# Patient Record
Sex: Female | Born: 1935 | Race: White | Hispanic: No | Marital: Married | Smoking: Never smoker
Health system: Southern US, Community
[De-identification: ages and names within clinical notes are randomized; demographics above are authoritative.]

## PROBLEM LIST (undated history)

## (undated) DIAGNOSIS — A419 Sepsis, unspecified organism: Secondary | ICD-10-CM

## (undated) DIAGNOSIS — I2699 Other pulmonary embolism without acute cor pulmonale: Secondary | ICD-10-CM

## (undated) DIAGNOSIS — I2510 Atherosclerotic heart disease of native coronary artery without angina pectoris: Secondary | ICD-10-CM

## (undated) DIAGNOSIS — N390 Urinary tract infection, site not specified: Secondary | ICD-10-CM

## (undated) HISTORY — PX: ABDOMINAL HYSTERECTOMY: SHX81

## (undated) HISTORY — PX: APPENDECTOMY: SHX54

---

## 2014-11-02 ENCOUNTER — Ambulatory Visit
Admission: EM | Admit: 2014-11-02 | Discharge: 2014-11-02 | Disposition: A | Discharge status: Home / Self Care | Payer: BLUE CROSS/BLUE SHIELD | Attending: Internal Medicine | Admitting: Internal Medicine

## 2014-11-02 DIAGNOSIS — G5602 Carpal tunnel syndrome, left upper limb: Secondary | ICD-10-CM

## 2014-11-02 HISTORY — DX: Atherosclerotic heart disease of native coronary artery without angina pectoris: I25.10

## 2014-11-02 HISTORY — DX: Urinary tract infection, site not specified: N39.0

## 2014-11-02 HISTORY — DX: Other pulmonary embolism without acute cor pulmonale: I26.99

## 2014-11-02 HISTORY — DX: Sepsis, unspecified organism: A41.9

## 2014-11-02 MED ORDER — GABAPENTIN 100 MG PO CAPS: 100.0000 mg | ORAL_CAPSULE | Freq: Two times a day (BID) | ORAL | Status: DC

## 2014-11-02 NOTE — Discharge Instructions (Signed)
Your left hand/wrist symptoms seem most consistent with an exacerbation of carpal tunnel syndrome.  Wear splint all the time for the next few days; when pain is not waking you up in the night, can probably change to just wearing it at night. Prescription for gabapentin sent to the [facility] in [place] to try. Keep appointment with Dr Hollace Kinnierarol Klein on 7/13, as scheduled.  Carpal Tunnel Syndrome Carpal tunnel syndrome is a disorder of the nervous system in the wrist that causes pain, hand weakness, and/or loss of feeling. Carpal tunnel syndrome is caused by the compression, stretching, or irritation of the median nerve at the wrist joint. Athletes who experience carpal tunnel syndrome may notice a decrease in their performance to the condition, especially for sports that require strong hand or wrist action.  SYMPTOMS   Tingling, numbness, or burning pain in the hand or fingers.  Inability to sleep due to pain in the hand.  Sharp pains that shoot from the wrist up the arm or to the fingers, especially at night.  Morning stiffness or cramping of the hand.  Thumb weakness, resulting in difficulty holding objects or making a fist.  Shiny, dry skin on the hand.  Reduced performance in any sport requiring a strong grip. CAUSES   Median nerve damage at the wrist is caused by pressure due to swelling, inflammation, or scarred tissue.  Sources of pressure include:  Repetitive gripping or squeezing that causes inflammation of the tendon sheaths.  Scarring or shortening of the ligament that covers the median nerve.  Traumatic injury to the wrist or forearm such as fracture, sprain, or dislocation.  Prolonged hyperextension (wrist bent backward) or hyperflexion (wrist bent downward) of the wrist. RISK INCREASES WITH:  Diabetes mellitus.  Menopause or amenorrhea.  Rheumatoid arthritis.  Raynaud disease.  Pregnancy.  Gout.  Kidney disease.  Ganglion cyst.  Repetitive hand or wrist  action.  Hypothyroidism (underactive thyroid gland).  Repetitive jolting or shaking of the hands or wrist.  Prolonged forceful weight-bearing on the hands. PREVENTION  Bracing the hand and wrist straight during activities that involve repetitive grasping.  For activities that require prolonged extension of the wrist (bending towards the top of the forearm) periodically change the position of your wrists.  Learn and use proper technique in activities that result in the wrist position in neutral to slight extension.  Avoid bending the wrist into full extension or flexion (up or down).  Keep the wrist in a straight (neutral) position. To keep the wrist in this position, wear a splint.  Avoid repetitive hand and wrist motions.  When possible avoid prolonged grasping of items (steering wheel of a car, a pen, a vacuum cleaner, or a rake).  Loosen your grip for activities that require prolonged grasping of items.  Place keyboards and writing surfaces at the correct height as to decrease strain on the wrist and hand.  Alternate work tasks to avoid prolonged wrist flexion.  Avoid pinching activities (needlework and writing) as they may irritate your carpal tunnel syndrome.  If these activities are necessary, complete them for shorter periods of time.  When writing, use a felt tip or rollerball pen and/or build up the grip on a pen to decrease the forces required for writing. PROGNOSIS  Carpal tunnel syndrome is usually curable with appropriate conservative treatment and sometimes resolves spontaneously. For some cases, surgery is necessary, especially if muscle wasting or nerve changes have developed.  RELATED COMPLICATIONS   Permanent numbness and a weak thumb or  fingers in the affected hand.  Permanent paralysis of a portion of the hand and fingers. TREATMENT  Treatment initially consists of stopping activities that aggravate the symptoms as well as medication and ice to reduce  inflammation. A wrist splint is often recommended for wear during activities of repetitive motion as well as at night. It is also important to learn and use proper technique when performing activities that typically cause pain. On occasion, a corticosteroid injection may be given. If symptoms persist despite conservative treatment, surgery may be an option. Surgical techniques free the pinched or compressed nerve. Carpal tunnel surgery is usually performed on an outpatient basis, meaning you go home the same day as surgery. These procedures provide almost complete relief of all symptoms in 95% of patients. Expect at least 2 weeks for healing after surgery. For cases that are the result of repeated jolting or shaking of the hand or wrist or prolonged hyperextension, surgery is not usually recommended because stretching of the median nerve, not compression, is usually the cause of carpal tunnel syndrome in these cases. MEDICATION   If pain medication is necessary, nonsteroidal anti-inflammatory medications, such as aspirin and ibuprofen, or other minor pain relievers, such as acetaminophen, are often recommended.  Do not take pain medication for 7 days before surgery.  Prescription pain relievers are usually only prescribed after surgery. Use only as directed and only as much as you need.  Corticosteroid injections may be given to reduce inflammation. However, they are not always recommended.  Vitamin B6 (pyridoxine) may reduce symptoms; use only if prescribed for your disorder. SEEK MEDICAL CARE IF:   Symptoms get worse or do not improve in 2 weeks despite treatment.  You also have a current or recent history of neck or shoulder injury that has resulted in pain or tingling elsewhere in your arm. Document Released: 04/10/2005 Document Revised: 08/25/2013 Document Reviewed: 07/23/2008 [facility] Patient Information 2015 [place], [place]. This information is not intended to replace advice given to you by  your health care provider. Make sure you discuss any questions you have with your health care provider.

## 2014-11-02 NOTE — ED Provider Notes (Signed)
CSN: [id number]     Arrival date & time 11/02/14  4098 History   First MD Initiated Contact with Patient 11/02/14 [id number]     Chief Complaint  Patient presents with  . Hand Pain   HPI Patient is an 81 year old lady who presents with several days history of discomfort in the left hand/wrist, radial aspect. Fingers are tingling, with diminished sensation at times. Pain has been waking her at night for the last 2 nights. She reports that this is actually not a new problem, but she hasn't had this much trouble with that in a long time. She shucked a lot of corn last week, and made pickles, both of which involve a lot of wrist motion. She feels fine otherwise. Range of motion in the hand appears to be intact.   Past Medical History  Diagnosis Date  . Coronary artery disease   . PE (pulmonary embolism)   . Sepsis   . UTI (lower urinary tract infection)    Past Surgical History  Procedure Laterality Date  . Appendectomy    . Abdominal hysterectomy     No family history on file. History  Substance Use Topics  . Smoking status: Never Smoker   . Smokeless tobacco: Not on file  . Alcohol Use: No    Review of Systems  All other systems reviewed and are negative.   Allergies  Review of patient's allergies indicates no known allergies.  Home Medications   Prior to Admission medications   Medication Sig Start Date End Date Taking? Authorizing Provider  aspirin 81 MG tablet Take 81 mg by mouth daily.   Yes Historical Provider, MD  atenolol (TENORMIN) 25 MG tablet Take 12.5 mg by mouth daily.   Yes Historical Provider, MD  cefaclor (CECLOR) 500 MG capsule Take 500 mg by mouth 3 (three) times daily.   Yes Historical Provider, MD  simvastatin (ZOCOR) 40 MG tablet Take 40 mg by mouth daily.   Yes Historical Provider, MD          BP 172/79 mmHg  Pulse 60  Temp(Src) 97.8 F (36.6 C) (Tympanic)  Resp 16  Ht  (1.727 m)  Wt 168 lb (76.204 kg)  BMI 25.55 kg/m2  SpO2 99%  LMP   Physical Exam  Constitutional: She is oriented to person, place, and time. No distress.  Alert, nicely groomed  HENT:  Head: Atraumatic.  Eyes:  Conjugate gaze, no eye redness/drainage  Neck: Neck supple.  Cardiovascular: Normal rate.   Pulmonary/Chest: No respiratory distress.  Abdominal: She exhibits no distension.  Musculoskeletal: Normal range of motion.  No leg swelling Patient is able to move her fingers freely, make a fist, move her wrist freely. No erythema, no bruising. No focal bony tenderness.  Neurological: She is alert and oriented to person, place, and time.  Skin: Skin is warm and dry.  No cyanosis  Nursing note and vitals reviewed.   ED Course  Procedures  Cock-up splint applied to the left wrist by nursing.  MDM   1. Carpal tunnel syndrome, left    Patient should wear the splint at all times for the next several days. When pain is not waking her in the night, she can move to just wearing the splint at bedtime. Discussed a prednisone burst with the patient, she has unfortunately had symptoms of agitation with prednisone in the past so we will hold off on this. Prescription for Neurontin 100 mg twice a day sent to the pharmacy. Patient will  follow-up with her primary care provider, Dr. Hollace Kinnierarol Klein in [place], in 2 days as planned.    Eustace MooreLaura W Lateria Alderman, MD 11/02/14 [phone]

## 2014-11-02 NOTE — ED Notes (Signed)
Pt states "I have pain and numbness in my left thumb, and fingers of my left hand."

## 2015-03-31 ENCOUNTER — Ambulatory Visit
Admission: EM | Admit: 2015-03-31 | Discharge: 2015-03-31 | Disposition: A | Discharge status: Home / Self Care | Payer: Medicare Other | Attending: Family Medicine | Admitting: Family Medicine

## 2015-03-31 ENCOUNTER — Encounter: Payer: Self-pay | Admitting: *Deleted

## 2015-03-31 DIAGNOSIS — M940 Chondrocostal junction syndrome [Tietze]: Secondary | ICD-10-CM | POA: Diagnosis not present

## 2015-03-31 DIAGNOSIS — S20211A Contusion of right front wall of thorax, initial encounter: Secondary | ICD-10-CM | POA: Diagnosis not present

## 2015-03-31 MED ORDER — HYDROCODONE-ACETAMINOPHEN 5-325 MG PO TABS: 1.0000 | ORAL_TABLET | Freq: Four times a day (QID) | ORAL | Status: DC | PRN

## 2015-03-31 NOTE — ED Provider Notes (Signed)
CSN: [id number]     Arrival date & time 03/31/15  [id number] History   First MD Initiated Contact with Patient 03/31/15 1204     Chief Complaint  Patient presents with  . Rib Injury    right side   (Consider location/radiation/quality/duration/timing/severity/associated sxs/prior Treatment) HPI Comments: 81 yo female presents with a 1 week h/o pain to her right lower rib area/rib cage after falling in the bathroom at home 1 week ago. States she tripped over a cord in the bathroom and hit her right lower ribcage against the wall. States pain has continued. Denies any fevers, chills, difficulty breathing, rash, bowel or bladder problems.  The history is provided by the patient.    Past Medical History  Diagnosis Date  . Coronary artery disease   . PE (pulmonary embolism)   . Sepsis (HCC)   . UTI (lower urinary tract infection)    Past Surgical History  Procedure Laterality Date  . Appendectomy    . Abdominal hysterectomy     History reviewed. No pertinent family history. Social History  Substance Use Topics  . Smoking status: Never Smoker   . Smokeless tobacco: Never Used  . Alcohol Use: No   OB History    No data available     Review of Systems  Allergies  Review of patient's allergies indicates no known allergies.  Home Medications   Prior to Admission medications   Medication Sig Start Date End Date Taking? Authorizing Provider  aspirin 81 MG tablet Take 81 mg by mouth daily.   Yes Historical Provider, MD  atenolol (TENORMIN) 25 MG tablet Take 12.5 mg by mouth daily.   Yes Historical Provider, MD  cefaclor (CECLOR) 500 MG capsule Take 500 mg by mouth 3 (three) times daily.   Yes Historical Provider, MD  cholecalciferol (VITAMIN D) 1000 UNITS tablet Take 1,000 Units by mouth daily.   Yes Historical Provider, MD  sertraline (ZOLOFT) 50 MG tablet Take 50 mg by mouth daily.   Yes Historical Provider, MD  simvastatin (ZOCOR) 40 MG tablet Take 40 mg by mouth daily.   Yes  Historical Provider, MD  vitamin B-12 (CYANOCOBALAMIN) 100 MCG tablet Take 100 mcg by mouth daily.   Yes Historical Provider, MD  gabapentin (NEURONTIN) 100 MG capsule Take 1 capsule (100 mg total) by mouth 2 (two) times daily. 11/02/14   Eustace MooreLaura W Murray, MD  HYDROcodone-acetaminophen (NORCO/VICODIN) 5-325 MG tablet Take 1 tablet by mouth every 6 (six) hours as needed. 03/31/15   Payton Mccallumrlando Bodee Lafoe, MD   Meds Ordered and Administered this Visit  Medications - No data to display  BP 160/68 mmHg  Pulse 63  Temp(Src) 97.7 F (36.5 C) (Oral)  Resp 18  Ht 5\' 7"  (1.702 m)  Wt 169 lb (76.658 kg)  BMI 26.46 kg/m2  SpO2 98% No data found.   Physical Exam  Constitutional: She appears well-developed and well-nourished. No distress.  HENT:  Head: Normocephalic and atraumatic.  Right Ear: Tympanic membrane and ear canal normal.  Left Ear: Tympanic membrane and ear canal normal.  Nose: Mucosal edema and rhinorrhea present. No nose lacerations, sinus tenderness, nasal deformity, septal deviation or nasal septal hematoma. No epistaxis.  No foreign bodies. Right sinus exhibits maxillary sinus tenderness and frontal sinus tenderness. Left sinus exhibits maxillary sinus tenderness and frontal sinus tenderness.  Mouth/Throat: Uvula is midline and mucous membranes are normal.  Eyes: Conjunctivae and EOM are normal. Pupils are equal, round, and reactive to light. Right eye exhibits no discharge.  Left eye exhibits no discharge. No scleral icterus.  Neck: Normal range of motion. Neck supple. No thyromegaly present.  Cardiovascular: Normal rate, regular rhythm and normal heart sounds.   Pulmonary/Chest: Effort normal and breath sounds normal. No respiratory distress. She has no wheezes. She has no rales.  Musculoskeletal:  Right lower rib tenderness to palpation; no step off; no erythema, edema, of skin lesions  Lymphadenopathy:    She has no cervical adenopathy.  Skin: She is not diaphoretic.  Nursing note and  vitals reviewed.   ED Course  Procedures (including critical care time)  Labs Review Labs Reviewed - No data to display  Imaging Review No results found.   Visual Acuity Review  Right Eye Distance:   Left Eye Distance:   Bilateral Distance:    Right Eye Near:   Left Eye Near:    Bilateral Near:         MDM   1. Costochondritis   2. Rib contusion, right, initial encounter     Discharge Medication List as of 03/31/2015 12:48 PM    START taking these medications   Details  HYDROcodone-acetaminophen (NORCO/VICODIN) 5-325 MG tablet Take 1 tablet by mouth every 6 (six) hours as needed., Starting 03/31/2015, Until Discontinued, Print       1. diagnosis reviewed with patient 2. rx as per orders above; reviewed possible side effects, interactions, risks and benefits  3. Recommend supportive treatment with rest, ice 4. Follow-up prn if symptoms worsen or don't improve  Payton Mccallum, MD 03/31/15 1431

## 2015-03-31 NOTE — Discharge Instructions (Signed)

## 2015-03-31 NOTE — ED Notes (Signed)
Patient fell about one week ago in her bathroom and bump her right side rib cage. Patient has continued to have pain and is concerned.

## 2016-12-13 ENCOUNTER — Ambulatory Visit: Payer: Medicare Other

## 2016-12-13 ENCOUNTER — Ambulatory Visit
Admission: EM | Admit: 2016-12-13 | Discharge: 2016-12-13 | Disposition: A | Discharge status: Home / Self Care | Payer: Medicare Other | Attending: Family Medicine | Admitting: Family Medicine

## 2016-12-13 ENCOUNTER — Encounter: Payer: Self-pay | Admitting: *Deleted

## 2016-12-13 DIAGNOSIS — I2510 Atherosclerotic heart disease of native coronary artery without angina pectoris: Secondary | ICD-10-CM | POA: Diagnosis not present

## 2016-12-13 DIAGNOSIS — S93601A Unspecified sprain of right foot, initial encounter: Secondary | ICD-10-CM | POA: Diagnosis not present

## 2016-12-13 DIAGNOSIS — Z86711 Personal history of pulmonary embolism: Secondary | ICD-10-CM | POA: Insufficient documentation

## 2016-12-13 DIAGNOSIS — M79671 Pain in right foot: Secondary | ICD-10-CM | POA: Diagnosis present

## 2016-12-13 DIAGNOSIS — X501XXA Overexertion from prolonged static or awkward postures, initial encounter: Secondary | ICD-10-CM | POA: Insufficient documentation

## 2016-12-13 DIAGNOSIS — Z79899 Other long term (current) drug therapy: Secondary | ICD-10-CM | POA: Diagnosis not present

## 2016-12-13 DIAGNOSIS — Z7982 Long term (current) use of aspirin: Secondary | ICD-10-CM | POA: Insufficient documentation

## 2016-12-13 NOTE — ED Triage Notes (Signed)
Patient rolled her right foot and ankle 1 week ago while walking outside.

## 2016-12-13 NOTE — ED Provider Notes (Signed)
[facility]    CSN: [id number] Arrival date & time: 12/13/16  1152     History   Chief Complaint Chief Complaint  Patient presents with  . Ankle Pain    HPI Kara Benjamin is a 83 y.o. female.   83 yo female with a c/o right foot pain and swelling for 1 week since twisting it one week ago. Has been able to ambulate/weightbear, however it's painful.    The history is provided by the patient.  Ankle Pain    Past Medical History:  Diagnosis Date  . Coronary artery disease   . PE (pulmonary embolism)   . Sepsis (HCC)   . UTI (lower urinary tract infection)     There are no active problems to display for this patient.   Past Surgical History:  Procedure Laterality Date  . ABDOMINAL HYSTERECTOMY    . APPENDECTOMY      OB History    No data available       Home Medications    Prior to Admission medications   Medication Sig Start Date End Date Taking? Authorizing Provider  aspirin 81 MG tablet Take 81 mg by mouth daily.   Yes [provider]  atenolol (TENORMIN) 25 MG tablet Take 12.5 mg by mouth daily.   Yes [provider]  cefaclor (CECLOR) 500 MG capsule Take 500 mg by mouth 3 (three) times daily.   Yes [provider]  cholecalciferol (VITAMIN D) 1000 UNITS tablet Take 1,000 Units by mouth daily.   Yes [provider]  sertraline (ZOLOFT) 50 MG tablet Take 50 mg by mouth daily.   Yes [provider]  simvastatin (ZOCOR) 40 MG tablet Take 40 mg by mouth daily.   Yes [provider]  vitamin B-12 (CYANOCOBALAMIN) 100 MCG tablet Take 100 mcg by mouth daily.   Yes [provider]  gabapentin (NEURONTIN) 100 MG capsule Take 1 capsule (100 mg total) by mouth 2 (two) times daily. 11/02/14   Eustace Moore, MD  HYDROcodone-acetaminophen (NORCO/VICODIN) 5-325 MG tablet Take 1 tablet by mouth every 6 (six) hours as needed. 03/31/15   Payton Mccallum, MD    Family History History reviewed. No  pertinent family history.  Social History Social History  Substance Use Topics  . Smoking status: Never Smoker  . Smokeless tobacco: Never Used  . Alcohol use No     Allergies   Patient has no known allergies.   Review of Systems Review of Systems   Physical Exam Triage Vital Signs ED Triage Vitals  Enc Vitals Group     BP 12/13/16 1214 (!) 158/67     Pulse Rate 12/13/16 1214 60     Resp 12/13/16 1214 16     Temp 12/13/16 1214 97.8 F (36.6 C)     Temp Source 12/13/16 1214 Oral     SpO2 12/13/16 1214 96 %     Weight 12/13/16 1217 170 lb (77.1 kg)     Height 12/13/16 1217 5\' 7"  (1.702 m)     Head Circumference --      Peak Flow --      Pain Score 12/13/16 1217 0     Pain Loc --      Pain Edu? --      Excl. in GC? --    No data found.   Updated Vital Signs BP (!) 158/67 (BP Location: Left Arm)   Pulse 60   Temp 97.8 F (36.6 C) (Oral)  Resp 16   Ht 5\' 7"  (1.702 m)   Wt 170 lb (77.1 kg)   SpO2 96%   BMI 26.63 kg/m   Visual Acuity Right Eye Distance:   Left Eye Distance:   Bilateral Distance:    Right Eye Near:   Left Eye Near:    Bilateral Near:     Physical Exam  Constitutional: She appears well-developed and well-nourished. No distress.  Musculoskeletal:       Right foot: There is tenderness, bony tenderness and swelling. There is normal range of motion, normal capillary refill, no crepitus, no deformity and no laceration.  Skin: She is not diaphoretic.  Nursing note and vitals reviewed.    UC Treatments / Results  Labs (all labs ordered are listed, but only abnormal results are displayed) Labs Reviewed - No data to display  EKG  EKG Interpretation None       Radiology Dg Foot Complete Right  Result Date: 12/13/2016 CLINICAL DATA:  Right foot injury 1 week ago with persistent pain over the lateral aspect of the foot near the base of the fifth metatarsal. EXAM: RIGHT FOOT COMPLETE - 3+ VIEW COMPARISON:  None in PACs FINDINGS: The  bones are subjectively osteopenic. There is no acute fracture nor dislocation. There is mild degenerative narrowing of the interphalangeal joints. The MTP and TMT joint spaces are reasonably well-maintained. Specific attention to the base of the fifth metatarsal and the adjacent bones reveals no acute fracture. There are cystic changes in the third cuneiform. The soft tissues are unremarkable. There are plantar and Achilles region calcaneal spurs. IMPRESSION: There is no acute fracture nor dislocation of the bones of the right foot. If the patient's symptoms persist and remain unexplained, MRI would be a useful next imaging step. Electronically Signed   By: David  [place] M.D.   On: 12/13/2016 13:02    Procedures Procedures (including critical care time)  Medications Ordered in UC Medications - No data to display   Initial Impression / Assessment and Plan / UC Course  I have reviewed the triage vital signs and the nursing notes.  Pertinent labs & imaging results that were available during my care of the patient were reviewed by me and considered in my medical decision making (see chart for details).       Final Clinical Impressions(s) / UC Diagnoses   Final diagnoses:  Sprain of right foot, initial encounter    New Prescriptions New Prescriptions   No medications on file   1. X-ray result (negative for fracture) and diagnosis reviewed with patient 2.Recommend supportive treatment with rest, ice,elevation, otc tylenol prn 3. Follow-up prn if symptoms worsen or don't improve  Controlled Substance Prescriptions [place] Controlled Substance Registry consulted? Not Applicable   Payton Mccallum, MD 12/13/16 [phone]

## 2016-12-13 NOTE — Discharge Instructions (Signed)
Rest, ice, elevation, over the counter tylenol °

## 2020-03-25 ENCOUNTER — Other Ambulatory Visit: Payer: Self-pay

## 2020-03-25 ENCOUNTER — Ambulatory Visit (INDEPENDENT_AMBULATORY_CARE_PROVIDER_SITE_OTHER): Payer: BLUE CROSS/BLUE SHIELD

## 2020-03-25 ENCOUNTER — Ambulatory Visit
Admission: EM | Admit: 2020-03-25 | Discharge: 2020-03-25 | Disposition: A | Discharge status: Home / Self Care | Payer: BLUE CROSS/BLUE SHIELD

## 2020-03-25 ENCOUNTER — Encounter: Payer: Self-pay | Admitting: Emergency Medicine

## 2020-03-25 DIAGNOSIS — S52551A Other extraarticular fracture of lower end of right radius, initial encounter for closed fracture: Secondary | ICD-10-CM

## 2020-03-25 DIAGNOSIS — S6991XA Unspecified injury of right wrist, hand and finger(s), initial encounter: Secondary | ICD-10-CM

## 2020-03-25 MED ORDER — TRAMADOL HCL 50 MG PO TABS: 50.0000 mg | ORAL_TABLET | Freq: Four times a day (QID) | ORAL | 0 refills | Status: AC | PRN

## 2020-03-25 NOTE — ED Provider Notes (Signed)
[facility]    CSN: [id number] Arrival date & time: 03/25/20  1301      History   Chief Complaint Chief Complaint  Patient presents with  . Fall    DOI 03/24/20  . Wrist Injury    right    HPI Kara Benjamin is a 86 y.o. female who presents with R wrist pain from a fall yesterday. She states she was getting out of her recliner the the blanket was caught in the chair and her legs got tangled in a blanket and she lost her balance and  and landed on her R hand. Has bruising of her R thumb, but this or her hand does not hurt. Has been taking 2 Tylenols at a time and helps a little.   Past Medical History:  Diagnosis Date  . Coronary artery disease   . PE (pulmonary embolism)   . Sepsis (HCC)   . UTI (lower urinary tract infection)     There are no problems to display for this patient.   Past Surgical History:  Procedure Laterality Date  . ABDOMINAL HYSTERECTOMY    . APPENDECTOMY      OB History   No obstetric history on file.      Home Medications    Prior to Admission medications   Medication Sig Start Date End Date Taking? Authorizing Provider  cholecalciferol (VITAMIN D) 1000 UNITS tablet Take 1,000 Units by mouth daily.   Yes [provider]  losartan (COZAAR) 50 MG tablet Take 50 mg by mouth daily. 01/02/20  Yes [provider]  sertraline (ZOLOFT) 50 MG tablet Take 50 mg by mouth daily.   Yes [provider]  simvastatin (ZOCOR) 40 MG tablet Take 40 mg by mouth daily.   Yes [provider]  vitamin B-12 (CYANOCOBALAMIN) 100 MCG tablet Take 100 mcg by mouth daily.   Yes [provider]  aspirin 81 MG tablet Take 81 mg by mouth daily.    [provider]  atenolol (TENORMIN) 25 MG tablet Take 12.5 mg by mouth daily.    [provider]  carvedilol (COREG) 3.125 MG tablet Take 3.125 mg by mouth 2 (two) times daily. 02/17/20   [provider]  cefaclor (CECLOR) 500 MG capsule Take  500 mg by mouth 3 (three) times daily.    [provider]  gabapentin (NEURONTIN) 100 MG capsule Take 1 capsule (100 mg total) by mouth 2 (two) times daily. 11/02/14   Isa Rankin, MD  HYDROcodone-acetaminophen (NORCO/VICODIN) 5-325 MG tablet Take 1 tablet by mouth every 6 (six) hours as needed. 03/31/15   Payton Mccallum, MD    Family History Family History  Problem Relation Age of Onset  . Other Mother        passed in a nursing home  . Stroke Father     Social History Social History   Tobacco Use  . Smoking status: Never Smoker  . Smokeless tobacco: Never Used  Vaping Use  . Vaping Use: Never used  Substance Use Topics  . Alcohol use: No  . Drug use: No     Allergies   Sulfamethoxazole-trimethoprim and Codeine   Review of Systems Review of Systems  Musculoskeletal: Positive for joint swelling. Negative for back pain and neck pain.       + R wrist pain  Skin: Positive for color change. Negative for rash and wound.  Neurological: Negative for numbness.   Physical Exam Triage Vital Signs ED Triage Vitals  Enc Vitals Group     BP 03/25/20 1316 (!) 192/93     Pulse Rate 03/25/20 1316 79     Resp 03/25/20 1316 18     Temp 03/25/20 1316 97.7 F (36.5 C)     Temp Source 03/25/20 1316 Oral     SpO2 03/25/20 1316 99 %     Weight 03/25/20 1317 164 lb (74.4 kg)     Height 03/25/20 1317 5\' 7"  (1.702 m)     Head Circumference --      Peak Flow --      Pain Score 03/25/20 1315 8     Pain Loc --      Pain Edu? --      Excl. in GC? --    No data found.  Updated Vital Signs BP (!) 192/93 (BP Location: Left Arm)   Pulse 79   Temp 97.7 F (36.5 C) (Oral)   Resp 18   Ht 5\' 7"  (1.702 m)   Wt 164 lb (74.4 kg)   SpO2 99%   BMI 25.69 kg/m   Visual Acuity Right Eye Distance:   Left Eye Distance:   Bilateral Distance:    Right Eye Near:   Left Eye Near:    Bilateral Near:     Physical Exam Vitals and nursing note reviewed.  Constitutional:       General: She is not in acute distress.    Appearance: She is obese. She is not toxic-appearing.  HENT:     Head: Normocephalic.     Right Ear: External ear normal.     Left Ear: External ear normal.  Eyes:     General: No scleral icterus.    Conjunctiva/sclera: Conjunctivae normal.  Pulmonary:     Effort: Pulmonary effort is normal.  Musculoskeletal:     Cervical back: Neck supple.     Comments: R HAND/WRIST- has mild swelling and  ecchymosis of 2/3 of her thumb and dorsal of her  hand, but these areas are not tender. Has neg snuff box tenderness. Has moderate tenderness on distal radiolus with supination of her wrist and palpation of this area. Has decreased ROM of her wrist with no deformity present.   Skin:    General: Skin is warm and dry.     Findings: Bruising present. No erythema.  Neurological:     Mental Status: She is alert and oriented to person, place, and time.     Gait: Gait normal.  Psychiatric:        Mood and Affect: Mood normal.        Behavior: Behavior normal.        Thought Content: Thought content normal.        Judgment: Judgment normal.    UC Treatments / Results  Labs (all labs ordered are listed, but only abnormal results are displayed) Labs Reviewed - No data to display  EKG   Radiology No results found.  Procedures Procedures (including critical care time)  Medications Ordered in UC Medications - No data to display  Initial Impression / Assessment and Plan / UC Course  I have reviewed the triage vital signs and the nursing notes. Has distal radial non displaced fracture. She was placed on a brace and I prescribed her Tramadol to try for pain as noted. Needs to Fu with ortho tomorrow.  Pertinent  imaging results that were available during my care of the patient were reviewed by me and considered in my medical decision making (see  chart for details).   Final Clinical Impressions(s) / UC Diagnoses   Final diagnoses:  None   Discharge  Instructions   None    ED Prescriptions    None     PDMP not reviewed this encounter.   Garey Ham, PA-C 03/25/20 1702

## 2020-03-25 NOTE — ED Triage Notes (Signed)
Patient in today c/o right wrist injury on 03/24/20. Patient states she was getting up and got tangle in a blanket and lost her balance catching herself with her right wrist. Patient has taken OTC Tylenol and Ibuprofen and has been wearing a brace.

## 2020-03-29 ENCOUNTER — Other Ambulatory Visit: Payer: Self-pay

## 2020-03-29 ENCOUNTER — Encounter: Payer: Self-pay | Admitting: Emergency Medicine

## 2020-03-29 ENCOUNTER — Ambulatory Visit
Admission: EM | Admit: 2020-03-29 | Discharge: 2020-03-29 | Disposition: A | Discharge status: Home / Self Care | Payer: Medicare Other | Attending: Emergency Medicine | Admitting: Emergency Medicine

## 2020-03-29 ENCOUNTER — Ambulatory Visit (INDEPENDENT_AMBULATORY_CARE_PROVIDER_SITE_OTHER): Payer: Medicare Other

## 2020-03-29 DIAGNOSIS — S2231XA Fracture of one rib, right side, initial encounter for closed fracture: Secondary | ICD-10-CM

## 2020-03-29 DIAGNOSIS — W19XXXA Unspecified fall, initial encounter: Secondary | ICD-10-CM

## 2020-03-29 DIAGNOSIS — R0781 Pleurodynia: Secondary | ICD-10-CM

## 2020-03-29 NOTE — Discharge Instructions (Addendum)
The chest x-ray shows that you broke your fourth rib on the right side.  This is most likely what is contributing to your pain with movement.  Continue your pain medication as prescribed for your open thumb at home.  Take 10 deep breaths every couple of hours while awake to help keep your lungs inflated and prevent the development of pneumonia.  If you develop shortness of breath or cough up blood then you need to go to the ER for evaluation.

## 2020-03-29 NOTE — ED Triage Notes (Signed)
Patient c/o right side rib pain that started on 03/24/20 after she fell. She states the pain has not gone away and is concerned that she may have broken a rib.

## 2020-03-29 NOTE — ED Provider Notes (Signed)
[facility]    CSN: [id number] Arrival date & time: 03/29/20  1111      History   Chief Complaint Chief Complaint  Patient presents with  . Chest Pain    right side    HPI Kara Benjamin is a 86 y.o. female.   HPI   86 year old female here for evaluation of right posterior rib pain.  Patient reports that she tripped and fell 5 days ago and broke her right thumb.  She reports she did not have any pain in her ribs the day of the injury but noticed that a few days later because it hurt to roll over in bed.  Patient states she is having pain under her right shoulder blade.  She denies shortness of breath, cough, or fever.  Patient has been taking tramadol, Tylenol, and ibuprofen at home which has been helping with the pain.  Past Medical History:  Diagnosis Date  . Coronary artery disease   . PE (pulmonary embolism)   . Sepsis (HCC)   . UTI (lower urinary tract infection)     There are no problems to display for this patient.   Past Surgical History:  Procedure Laterality Date  . ABDOMINAL HYSTERECTOMY    . APPENDECTOMY      OB History   No obstetric history on file.      Home Medications    Prior to Admission medications   Medication Sig Start Date End Date Taking? Authorizing Provider  atenolol (TENORMIN) 25 MG tablet Take 12.5 mg by mouth daily.   Yes [provider]  carvedilol (COREG) 3.125 MG tablet Take 3.125 mg by mouth 2 (two) times daily. 02/17/20  Yes [provider]  cholecalciferol (VITAMIN D) 1000 UNITS tablet Take 1,000 Units by mouth daily.   Yes [provider]  losartan (COZAAR) 50 MG tablet Take 50 mg by mouth daily. 01/02/20  Yes [provider]  sertraline (ZOLOFT) 50 MG tablet Take 50 mg by mouth daily.   Yes [provider]  simvastatin (ZOCOR) 40 MG tablet Take 40 mg by mouth daily.   Yes [provider]  traMADol (ULTRAM) 50 MG tablet Take 1 tablet (50 mg total) by mouth  every 6 (six) hours as needed. 03/25/20  Yes Rodriguez-Southworth, Nettie Elm, PA-C  vitamin B-12 (CYANOCOBALAMIN) 100 MCG tablet Take 100 mcg by mouth daily.   Yes [provider]  gabapentin (NEURONTIN) 100 MG capsule Take 1 capsule (100 mg total) by mouth 2 (two) times daily. 11/02/14 03/25/20  Isa Rankin, MD    Family History Family History  Problem Relation Age of Onset  . Other Mother        passed in a nursing home  . Stroke Father     Social History Social History   Tobacco Use  . Smoking status: Never Smoker  . Smokeless tobacco: Never Used  Vaping Use  . Vaping Use: Never used  Substance Use Topics  . Alcohol use: No  . Drug use: No     Allergies   Sulfamethoxazole-trimethoprim and Codeine   Review of Systems Review of Systems  Constitutional: Negative for activity change, appetite change and fever.  Respiratory: Negative for cough, shortness of breath and wheezing.   Cardiovascular: Negative for chest pain.  Musculoskeletal: Positive for arthralgias and myalgias.  Skin: Positive for color change.  Neurological: Negative for syncope.  Hematological: Negative.   Psychiatric/Behavioral: Negative.      Physical Exam Triage Vital Signs ED Triage Vitals  Enc Vitals Group     BP 03/29/20 1124 (!) 211/81     Pulse Rate 03/29/20 1124 68     Resp 03/29/20 1124 18     Temp 03/29/20 1124 97.8 F (36.6 C)     Temp Source 03/29/20 1124 Oral     SpO2 03/29/20 1124 98 %     Weight 03/29/20 1123 164 lb 0.4 oz (74.4 kg)     Height 03/29/20 1123 5\' 7"  (1.702 m)     Head Circumference --      Peak Flow --      Pain Score 03/29/20 1122 10     Pain Loc --      Pain Edu? --      Excl. in GC? --    No data found.  Updated Vital Signs BP (!) 211/81 (BP Location: Left Arm)   Pulse 68   Temp 97.8 F (36.6 C) (Oral)   Resp 18   Ht 5\' 7"  (1.702 m)   Wt 164 lb 0.4 oz (74.4 kg)   SpO2 98%   BMI 25.69 kg/m   Visual Acuity Right Eye Distance:    Left Eye Distance:   Bilateral Distance:    Right Eye Near:   Left Eye Near:    Bilateral Near:     Physical Exam   UC Treatments / Results  Labs (all labs ordered are listed, but only abnormal results are displayed) Labs Reviewed - No data to display  EKG   Radiology DG Ribs Unilateral W/Chest Right  Result Date: 03/29/2020 CLINICAL DATA:  Fall 03/24/2020.  Right rib pain EXAM: RIGHT RIBS AND CHEST - 3+ VIEW COMPARISON:  None. FINDINGS: Heart size and vascularity normal. Lungs are clear without infiltrate or effusion. Hiatal hernia noted. Prior median sternotomy Mildly displaced fracture right fourth rib anteriorly. Suboptimal evaluation due to osteopenia. No pneumothorax. IMPRESSION: Mildly displaced fracture right anterior fourth rib. No acute cardiopulmonary abnormality Hiatal hernia. Electronically Signed   By: 14/09/2019 M.D.   On: 03/29/2020 12:09    Procedures Procedures (including critical care time)  Medications Ordered in UC Medications - No data to display  Initial Impression / Assessment and Plan / UC Course  I have reviewed the triage vital signs and the nursing notes.  Pertinent labs & imaging results that were available during my care of the patient were reviewed by me and considered in my medical decision making (see chart for details).   Patient is here for evaluation of pain that she describes as being under her right shoulder blade.  Patient fell 5 days ago and fractured her right thumb is currently in a splint.  Her right hand is swollen and ecchymotic.  Patient states that her pain in her right back started a few days after her fall and she noticed that she was having pain when she rolled over in bed.  Patient states it does not hurt to take a deep breath I cannot reproduce the pain well with palpation.  When palpating her right scapular region she actually has pain on the vertebral border of the right scapula in the superior aspect.  There is no  overlying ecchymosis.  No crepitus upon palpation.  Lungs are clear to auscultation.  We will obtain right rib film.  Rib films show a mildly displaced anterior fourth rib fracture.  Will discharge patient home, have her continue her pain medication as prescribed for her broken wrist, and instructed to do deep breathing exercises, 10 deep  breaths every couple hours while awake.   Final Clinical Impressions(s) / UC Diagnoses   Final diagnoses:  Closed fracture of one rib of right side, initial encounter     Discharge Instructions     The chest x-ray shows that you broke your fourth rib on the right side.  This is most likely what is contributing to your pain with movement.  Continue your pain medication as prescribed for your open thumb at home.  Take 10 deep breaths every couple of hours while awake to help keep your lungs inflated and prevent the development of pneumonia.  If you develop shortness of breath or cough up blood then you need to go to the ER for evaluation.    ED Prescriptions    None     PDMP not reviewed this encounter.   Becky Augusta, NP 03/29/20 1231

## 2022-07-27 IMAGING — CR DG RIBS W/ CHEST 3+V*R*
5 series · 5 of 5 positions shown · non-contrast
Comparison: None.

CLINICAL DATA: Fall 03/24/2020.  Right rib pain

EXAM:
RIGHT RIBS AND CHEST - 3+ VIEW

[chest pa]
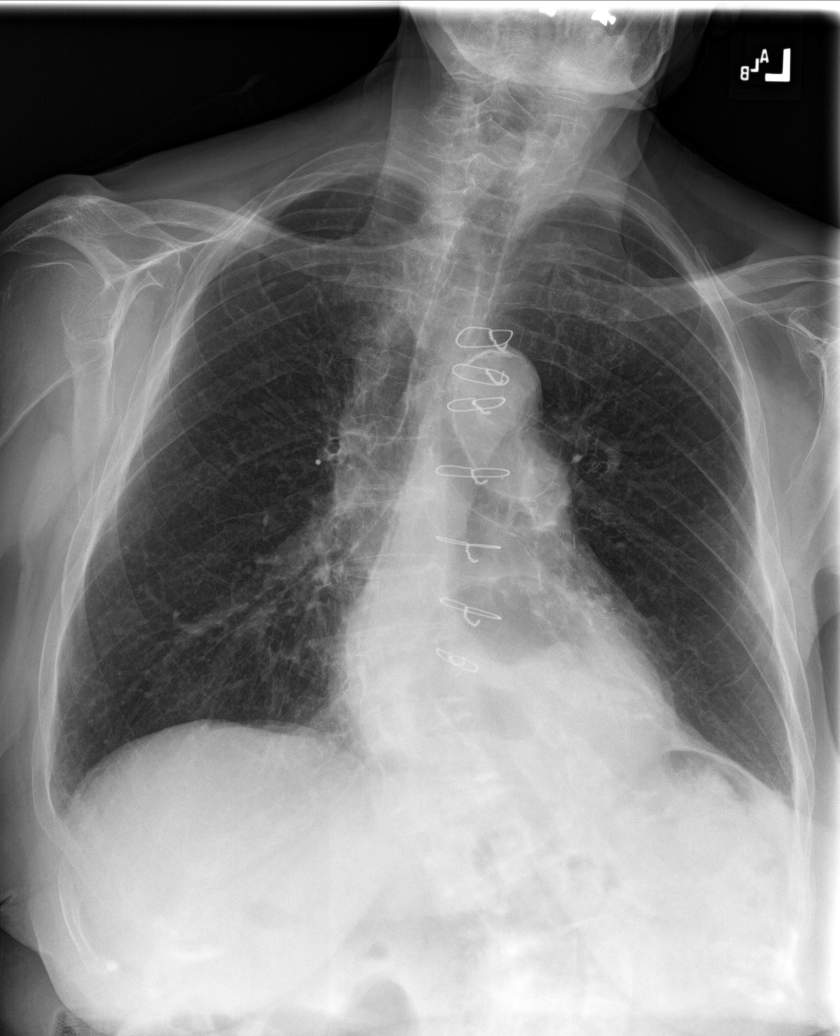

[rib pa (1 of 2)]
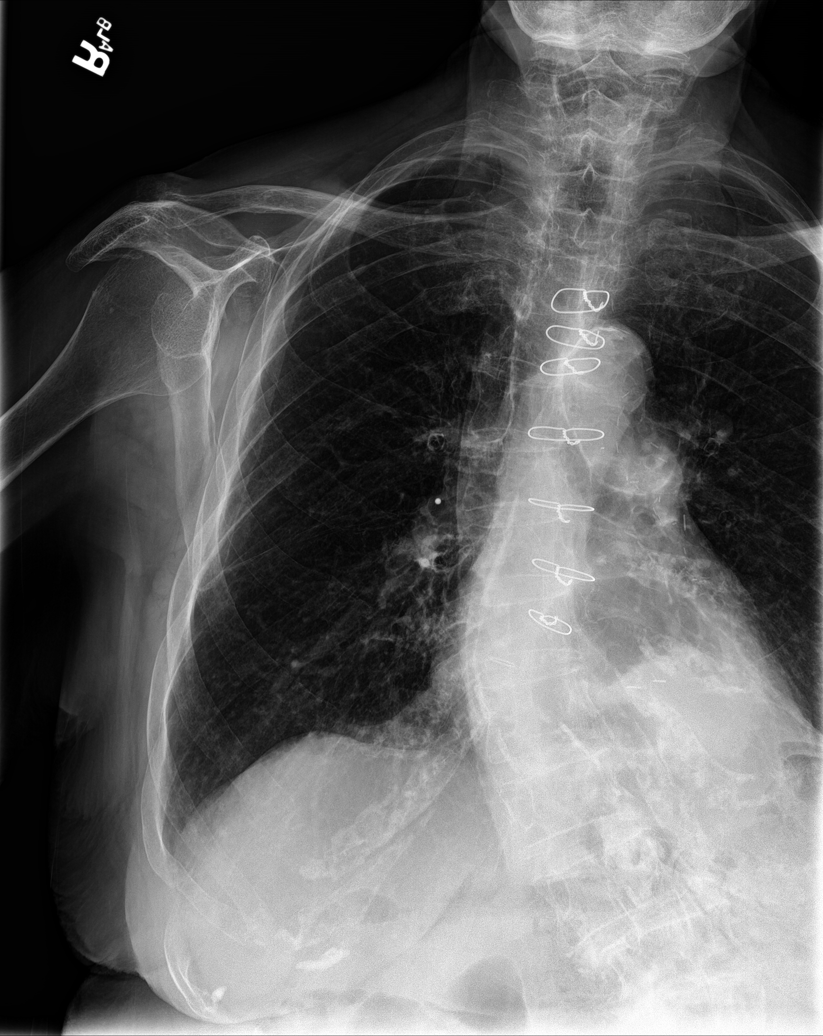

[rib pa (2 of 2)]
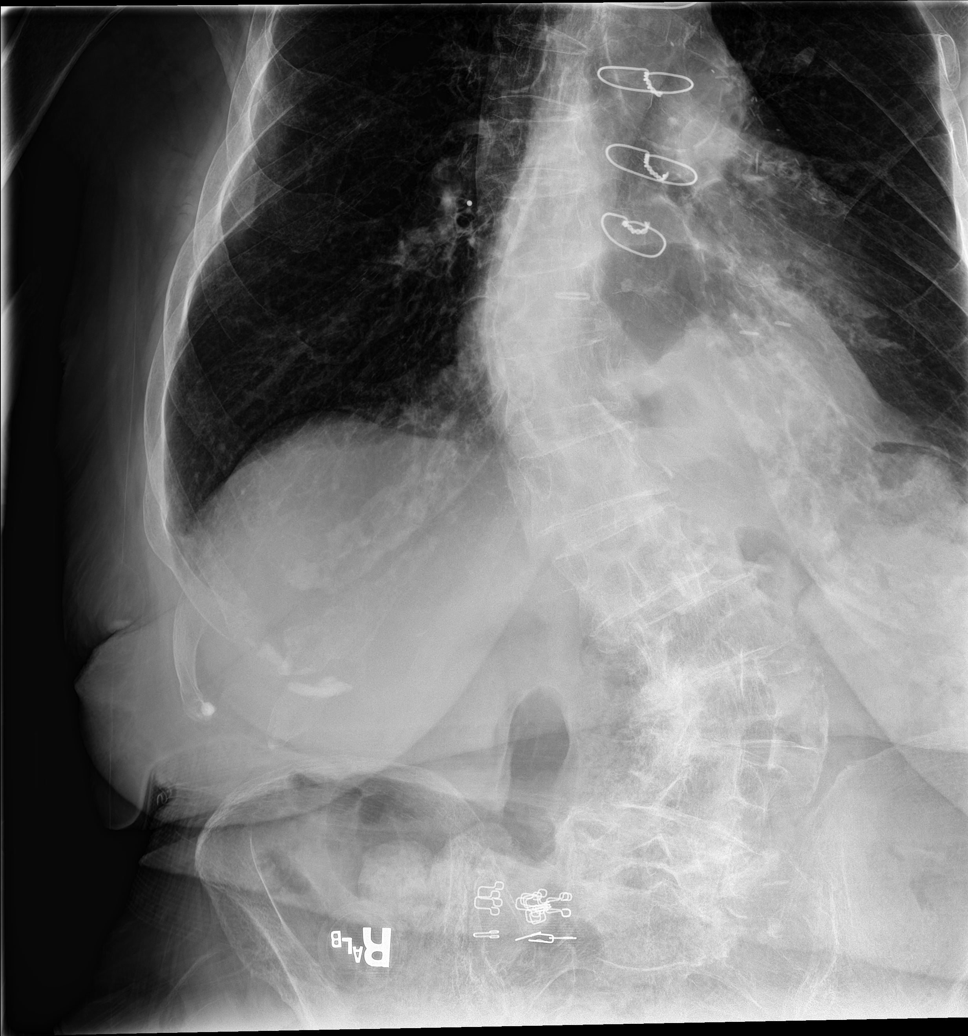

[rib obl (1 of 2)]
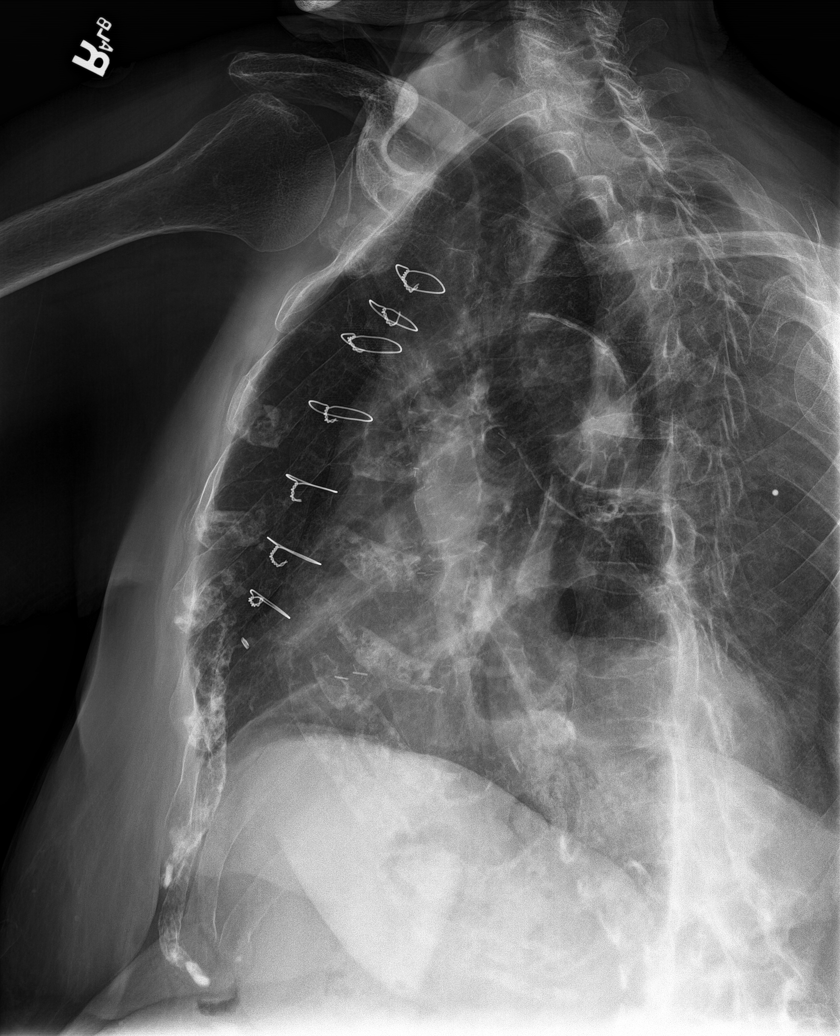

[rib obl (2 of 2)]
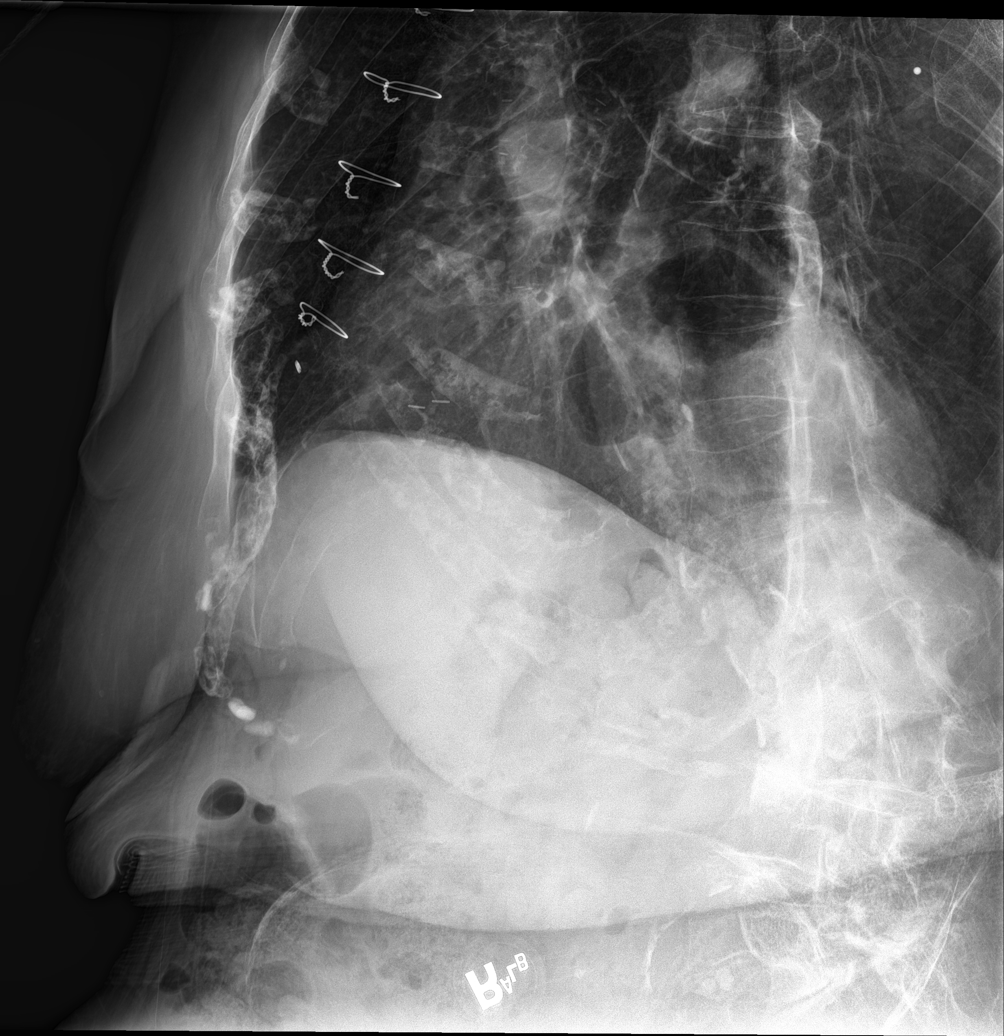

[5 of 5 positions shown; findings below may reference images not displayed]

FINDINGS: Heart size and vascularity normal. Lungs are clear without
infiltrate or effusion. Hiatal hernia noted. Prior median sternotomy

Mildly displaced fracture right fourth rib anteriorly. Suboptimal
evaluation due to osteopenia. No pneumothorax.
IMPRESSION: Mildly displaced fracture right anterior fourth rib.

No acute cardiopulmonary abnormality

Hiatal hernia.
